# Patient Record
Sex: Female | Born: 1965 | Race: Black or African American | Hispanic: No | Marital: Married | Smoking: Former smoker
Health system: Southern US, Community
[De-identification: ages and names within clinical notes are randomized; demographics above are authoritative.]

## PROBLEM LIST (undated history)

## (undated) DIAGNOSIS — I10 Essential (primary) hypertension: Secondary | ICD-10-CM

## (undated) DIAGNOSIS — K529 Noninfective gastroenteritis and colitis, unspecified: Secondary | ICD-10-CM

## (undated) HISTORY — PX: SKIN GRAFT: SHX250

## (undated) HISTORY — PX: FOOT SURGERY: SHX648

## (undated) HISTORY — PX: BACK SURGERY: SHX140

## (undated) HISTORY — PX: CERVICAL FUSION: SHX112

## (undated) HISTORY — PX: ABDOMINAL HYSTERECTOMY: SHX81

---

## 2011-04-08 ENCOUNTER — Encounter: Payer: Self-pay | Admitting: *Deleted

## 2011-04-08 ENCOUNTER — Emergency Department (HOSPITAL_COMMUNITY)
Admission: EM | Admit: 2011-04-08 | Discharge: 2011-04-08 | Discharge status: Against Medical Advice | Payer: BC Managed Care – PPO | Attending: Emergency Medicine | Admitting: Emergency Medicine

## 2011-04-08 DIAGNOSIS — R0602 Shortness of breath: Secondary | ICD-10-CM | POA: Insufficient documentation

## 2011-04-08 HISTORY — DX: Essential (primary) hypertension: I10

## 2011-04-08 NOTE — ED Notes (Signed)
Patient states that she is leaving and will come back if her symptoms get worse.

## 2011-04-08 NOTE — ED Notes (Signed)
To ed for eval of sob and body aches since being placed on bactrim for UTI. Prior to bactrim pt was on amoxicillin for 2 wks for GI symptoms. Pt speaking in full complete sentences. Skin w/d, resp e/u. No tongue swelling. No drooling.

## 2011-04-11 ENCOUNTER — Other Ambulatory Visit: Payer: Self-pay

## 2011-04-11 ENCOUNTER — Encounter (HOSPITAL_BASED_OUTPATIENT_CLINIC_OR_DEPARTMENT_OTHER): Payer: Self-pay | Admitting: *Deleted

## 2011-04-11 ENCOUNTER — Emergency Department (HOSPITAL_BASED_OUTPATIENT_CLINIC_OR_DEPARTMENT_OTHER)
Admission: EM | Admit: 2011-04-11 | Discharge: 2011-04-12 | Disposition: A | Discharge status: Home / Self Care | Payer: BC Managed Care – PPO | Attending: Emergency Medicine | Admitting: Emergency Medicine

## 2011-04-11 DIAGNOSIS — R1011 Right upper quadrant pain: Secondary | ICD-10-CM | POA: Insufficient documentation

## 2011-04-11 DIAGNOSIS — R209 Unspecified disturbances of skin sensation: Secondary | ICD-10-CM | POA: Insufficient documentation

## 2011-04-11 HISTORY — DX: Noninfective gastroenteritis and colitis, unspecified: K52.9

## 2011-04-11 LAB — COMPREHENSIVE METABOLIC PANEL
ALT: 18 U/L (ref 0–35)
AST: 17 U/L (ref 0–37)
CO2: 22 mEq/L (ref 19–32)
Chloride: 103 mEq/L (ref 96–112)
Glucose, Bld: 102 mg/dL — ABNORMAL HIGH (ref 70–99)
Sodium: 136 mEq/L (ref 135–145)

## 2011-04-11 LAB — URINALYSIS, ROUTINE W REFLEX MICROSCOPIC
Bilirubin Urine: NEGATIVE
Hgb urine dipstick: NEGATIVE
Nitrite: NEGATIVE
Specific Gravity, Urine: 1.011 (ref 1.005–1.030)
Urobilinogen, UA: 0.2 mg/dL (ref 0.0–1.0)
pH: 5.5 (ref 5.0–8.0)

## 2011-04-11 LAB — CBC
Hemoglobin: 10.9 g/dL — ABNORMAL LOW (ref 12.0–15.0)
MCH: 29.9 pg (ref 26.0–34.0)
Platelets: 239 10*3/uL (ref 150–400)
RBC: 3.64 MIL/uL — ABNORMAL LOW (ref 3.87–5.11)
WBC: 8.1 10*3/uL (ref 4.0–10.5)

## 2011-04-11 LAB — LIPASE, BLOOD: Lipase: 17 U/L (ref 11–59)

## 2011-04-11 MED ORDER — ESOMEPRAZOLE MAGNESIUM 40 MG PO CPDR: 40.0000 mg | DELAYED_RELEASE_CAPSULE | Freq: Every day | ORAL | Status: DC

## 2011-04-11 NOTE — ED Notes (Signed)
Dr. Webb at bedside

## 2011-04-11 NOTE — ED Notes (Signed)
Pt given water and crackers to assess PO status

## 2011-04-11 NOTE — ED Notes (Signed)
Pt tolerated PO intake fine. Denies complaints at this time. MD aware

## 2011-04-11 NOTE — ED Notes (Signed)
Encouraged sister of Pt. Not to let the pt. Eat.

## 2011-04-11 NOTE — ED Notes (Signed)
C/o RUQ pain that radiates around breast and behind right shoulder blade x3days. Worsens after eating. Some nausea. Denies N/V. Recent dx of UTI, but states those symptoms have resolved since being treated with ABX. No hx of gallbladder issues.

## 2011-04-11 NOTE — ED Notes (Signed)
Patient states she has had ruq abdominal pain for the last 2 days.  Was at [facility] PCP and sent here to r/o her gallbladder.  States she has been to [facility] on Saturday for bladder infections and started on medications.

## 2011-04-12 ENCOUNTER — Other Ambulatory Visit (HOSPITAL_BASED_OUTPATIENT_CLINIC_OR_DEPARTMENT_OTHER): Payer: BC Managed Care – PPO

## 2011-04-12 NOTE — ED Provider Notes (Signed)
History     CSN: [id number]  Arrival date & time 04/11/11  1758   First MD Initiated Contact with Patient 04/11/11 2027      Chief Complaint  Patient presents with  . Abdominal Pain    ruq    (Consider location/radiation/quality/duration/timing/severity/associated sxs/prior treatment) HPI  45yoF pw abdominal pain. Pt c/o 2-3 days of epigastric/ruq abdominal pain. Feels "like it's knotted up" and full. Exacerbating factors include eating and drinking. +nausea, denies vomiting. Dec appetite today. Denies cp/back pain/sob. Denies constipation, diarrhea. She is passing gas. She is also having occ burning sensation which she feels is her reflux. States that over the past two days she feels that her food is occasionally becoming "stuck" in her chest but passes with intake of liquids. No coughing or gagging, no difficulty swallowing. She states at max the pain is 6/10 but none currently. Denies fever/chills. No known h/o gallstones. She did have upper endoscopy performed by [facility] within the year which per pt showed gastritis when she had similar sx in the past. She also had similar sx a few months ago and was given prevpack by her PMD with resolution of symptoms. Abd surgeries incl hysterectomy.  States she is being treated for uti at this time. Denies hematuria/dysuria/freq/urgency. Initially on bactrim but changed to macrobid after allergic reaction. Taking prednisone for same.   ED Notes, ED Provider Notes from 04/11/11 0000 to 04/11/11 18:33:45       Shela Commons, RN 04/11/2011 18:11      Patient states she has had ruq abdominal pain for the last 2 days. Was at [facility] PCP and sent here to r/o her gallbladder. States she has been to [facility] on Saturday for bladder infections and started on medications.     Past Medical History  Diagnosis Date  . Gastroenteritis     Past Surgical History  Procedure Date  . Cervical fusion   . Skin graft     left ankle  . Abdominal hysterectomy    . Foot surgery     right    No family history on file.  History  Substance Use Topics  . Smoking status: Current Everyday Smoker -- 0.5 packs/day for 10 years    Types: Cigarettes  . Smokeless tobacco: Not on file  . Alcohol Use: Yes     occassionally    OB History    Grav Para Term Preterm Abortions TAB SAB Ect Mult Living                  Review of Systems  All other systems reviewed and are negative.   except as noted HPI  Allergies  Septra  Home Medications   Current Outpatient Rx  Name Route Sig Dispense Refill  . ESOMEPRAZOLE MAGNESIUM 40 MG PO CPDR Oral Take 1 capsule (40 mg total) by mouth daily. 30 capsule 0  . HYDROXYZINE HCL 25 MG PO TABS Oral Take 25 mg by mouth 4 (four) times daily as needed. For allergic reaction     . NITROFURANTOIN MACROCRYSTAL 100 MG PO CAPS Oral Take 100 mg by mouth 2 (two) times daily.      Marland Kitchen PREDNISONE 20 MG PO TABS Oral Take 60 mg by mouth daily.        BP 132/87  Pulse 63  Temp(Src) 98.2 F (36.8 C) (Oral)  Resp 18  Ht 5\' 6"  (1.676 m)  Wt 203 lb (92.08 kg)  BMI 32.76 kg/m2  SpO2 100%  Physical Exam  Nursing note and vitals reviewed. Constitutional: She is oriented to person, place, and time. She appears well-developed.  HENT:  Head: Atraumatic.  Mouth/Throat: Oropharynx is clear and moist.  Eyes: Conjunctivae and EOM are normal. Pupils are equal, round, and reactive to light.  Neck: Normal range of motion. Neck supple.       No stridor  Cardiovascular: Normal rate, regular rhythm, normal heart sounds and intact distal pulses.   Pulmonary/Chest: Effort normal and breath sounds normal. No respiratory distress. She has no wheezes. She has no rales. She exhibits no tenderness.  Abdominal: Soft. She exhibits no distension. There is tenderness. There is no rebound and no guarding.       Min epigastric and ruq ttp  Musculoskeletal: Normal range of motion.  Neurological: She is alert and oriented to person, place, and  time.  Skin: Skin is warm and dry. No rash noted.  Psychiatric: She has a normal mood and affect.    Date: 04/12/2011  Rate: 74  Rhythm: normal sinus rhythm  QRS Axis: normal  Intervals: normal  ST/T Wave abnormalities: nonspecific T wave changes  Conduction Disutrbances:none  Narrative Interpretation:   Old EKG Reviewed: none available   ED Course  Procedures (including critical care time)  Labs Reviewed  URINALYSIS, ROUTINE W REFLEX MICROSCOPIC - Abnormal; Notable for the following:    APPearance CLOUDY (*)    All other components within normal limits  CBC - Abnormal; Notable for the following:    RBC 3.64 (*)    Hemoglobin 10.9 (*)    HCT 33.0 (*)    All other components within normal limits  COMPREHENSIVE METABOLIC PANEL - Abnormal; Notable for the following:    Glucose, Bld 102 (*)    All other components within normal limits  LIPASE, BLOOD   No results found.   1. Abdominal pain     MDM  P/W intermittent epigastric/RUQ pain worsened with food. None currently. Ddx incl gastritis/pud, biliary colic, pancreatitis, less likely cholecystitis. Labs reviewed and unremarkable including LFTS, lipase. She is tolerating PO solids and liquids here without difficulty. She is comfortable with plan for discharge home with nexium and will return tomorrow for RUQ ultrasound. Patient comfortable with plan.      Forbes Cellar, MD 04/12/11 [phone]

## 2019-08-16 ENCOUNTER — Ambulatory Visit: Payer: Self-pay | Attending: Internal Medicine

## 2019-08-16 DIAGNOSIS — Z23 Encounter for immunization: Secondary | ICD-10-CM

## 2019-08-16 NOTE — Progress Notes (Signed)
   Covid-19 Vaccination Clinic  Name:  Brooke Hart    MRN: [id number] DOB: 04/13/1965  08/16/2019  Brooke Hart was observed post Covid-19 immunization for 15 minutes without incident. She was provided with Vaccine Information Sheet and instruction to access the V-Safe system.   Brooke Hart was instructed to call 911 with any severe reactions post vaccine: Marland Kitchen Difficulty breathing  . Swelling of face and throat  . A fast heartbeat  . A bad rash all over body  . Dizziness and weakness   Immunizations Administered    Name Date Dose VIS Date Route   Moderna COVID-19 Vaccine 08/16/2019 12:39 PM 0.5 mL 03/2019 Intramuscular   Manufacturer: Moderna   Lot: [id number]   NDC: 11031-594-58

## 2019-09-13 ENCOUNTER — Ambulatory Visit: Payer: Self-pay

## 2019-09-13 DIAGNOSIS — Z23 Encounter for immunization: Secondary | ICD-10-CM

## 2019-09-13 NOTE — Progress Notes (Signed)
   Covid-19 Vaccination Clinic  Name:  Brooke Hart    MRN: [id number] DOB: Aug 19, 1965  09/13/2019  Brooke Hart was observed post Covid-19 immunization for 15 minutes without incident. She was provided with Vaccine Information Sheet and instruction to access the V-Safe system.   Brooke Hart was instructed to call 911 with any severe reactions post vaccine: Marland Kitchen Difficulty breathing  . Swelling of face and throat  . A fast heartbeat  . A bad rash all over body  . Dizziness and weakness   Immunizations Administered    Name Date Dose VIS Date Route   Moderna COVID-19 Vaccine 09/13/2019 10:20 AM 0.5 mL 03/2019 Intramuscular   Manufacturer: Gala Murdoch   Lot: [id number]   NDC: 38329-191-66

## 2021-05-03 ENCOUNTER — Other Ambulatory Visit: Payer: Self-pay

## 2021-05-04 ENCOUNTER — Encounter: Payer: Self-pay | Admitting: Family Medicine

## 2021-05-04 ENCOUNTER — Ambulatory Visit (INDEPENDENT_AMBULATORY_CARE_PROVIDER_SITE_OTHER): Payer: Managed Care, Other (non HMO) | Admitting: Family Medicine

## 2021-05-04 ENCOUNTER — Encounter: Payer: Self-pay | Admitting: Neurology

## 2021-05-04 VITALS — BP 120/74 | HR 87 | Temp 97.0°F | Ht 66.0 in | Wt 240.4 lb

## 2021-05-04 DIAGNOSIS — H6982 Other specified disorders of Eustachian tube, left ear: Secondary | ICD-10-CM | POA: Diagnosis not present

## 2021-05-04 DIAGNOSIS — R202 Paresthesia of skin: Secondary | ICD-10-CM | POA: Diagnosis not present

## 2021-05-04 DIAGNOSIS — R0683 Snoring: Secondary | ICD-10-CM

## 2021-05-04 NOTE — Progress Notes (Signed)
Established Patient Office Visit  Subjective:  Patient ID: Brooke Hart, female    DOB: December 05, 1965  Age: 56 y.o. MRN: [id number]  CC:  Chief Complaint  Patient presents with   Establish Care    NP/establish care sinus issues, discuss carpal tunnel.     HPI Brooke Hart presents for establishment of care and for evaluation of medical issues that are bothering her.  Ongoing history of left ear congestion with occasional discomfort.  There is itching in the ear.  She denies nasal congestion, rhinorrhea, postnasal drip, sneezing, itchy watery eyes, cough.  She has tried nasal steroids in the past without relief.  She snores.  She does wake up some through the night.  She does not necessarily always feel rested in the morning.  She complains of paresthesias in both of her arms.  She tells of paresthesias in her right second finger.  She is left-handed.  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  History of cervical fusion 20 years ago.  Neck is sometimes stiff.  She denies radiation of pain from her neck.  Paresthesias are in her arms.  Past Medical History:  Diagnosis Date   Gastroenteritis     Past Surgical History:  Procedure Laterality Date   ABDOMINAL HYSTERECTOMY     CERVICAL FUSION     FOOT SURGERY     right   SKIN GRAFT     left ankle    No family history on file.  Social History   Socioeconomic History   Marital status: Married    Spouse name: Not on file   Number of children: Not on file   Years of education: Not on file   Highest education level: Not on file  Occupational History   Not on file  Tobacco Use   Smoking status: Former    Packs/day: 0.50    Years: 10.00    Pack years: 5.00    Types: Cigarettes    Quit date: 04/03/2014    Years since quitting: 7.0   Smokeless tobacco: Not on file  Vaping Use   Vaping Use: Never used  Substance and Sexual Activity   Alcohol use: Not  Currently    Comment: occassionally   Drug use: No   Sexual activity: Yes    Birth control/protection: Surgical  Other Topics Concern   Not on file  Social History Narrative   Not on file   Social Determinants of Health   Financial Resource Strain: Not on file  Food Insecurity: Not on file  Transportation Needs: Not on file  Physical Activity: Not on file  Stress: Not on file  Social Connections: Not on file  Intimate Partner Violence: Not on file    Outpatient Medications Prior to Visit  Medication Sig Dispense Refill   amLODipine-valsartan (EXFORGE) 5-160 MG tablet Take 1 tablet by mouth every morning.     hydrochlorothiazide (HYDRODIURIL) 12.5 MG tablet Take by mouth.     phentermine (ADIPEX-P) 37.5 MG tablet Take by mouth.     esomeprazole (NEXIUM) 40 MG capsule Take 1 capsule (40 mg total) by mouth daily. 30 capsule 0   hydrOXYzine (ATARAX/VISTARIL) 25 MG tablet Take 25 mg by mouth 4 (four) times daily as needed. For allergic reaction      No facility-administered medications prior to visit.    Allergies  Allergen Reactions   Septra [Sulfamethoxazole W/Trimethoprim (Co-Trimoxazole)] Shortness Of  Breath and Rash    COUGH, LEG PAIN & HEADACHE    ROS Review of Systems  Constitutional:  Negative for chills, diaphoresis, fatigue, fever and unexpected weight change.  HENT:  Positive for hearing loss. Negative for congestion, ear pain, postnasal drip, rhinorrhea and sneezing.   Eyes:  Negative for photophobia.  Respiratory:  Negative for cough and wheezing.   Cardiovascular: Negative.   Gastrointestinal: Negative.   Endocrine: Negative for polyphagia and polyuria.  Genitourinary: Negative.   Musculoskeletal:  Positive for neck stiffness. Negative for neck pain.  Neurological:  Positive for numbness. Negative for speech difficulty and weakness.     Objective:    Physical Exam Vitals and nursing note reviewed.  Constitutional:      General: She is not in acute  distress.    Appearance: Normal appearance. She is not ill-appearing, toxic-appearing or diaphoretic.  HENT:     Head: Normocephalic and atraumatic.     Right Ear: Tympanic membrane, ear canal and external ear normal.     Left Ear: Tympanic membrane, ear canal and external ear normal.     Mouth/Throat:     Mouth: Mucous membranes are moist.     Pharynx: Oropharynx is clear. No oropharyngeal exudate or posterior oropharyngeal erythema.   Eyes:     General: No visual field deficit or scleral icterus.       Right eye: No discharge.        Left eye: No discharge.     Extraocular Movements: Extraocular movements intact.     Conjunctiva/sclera: Conjunctivae normal.     Pupils: Pupils are equal, round, and reactive to light.  Cardiovascular:     Rate and Rhythm: Normal rate and regular rhythm.  Pulmonary:     Effort: Pulmonary effort is normal.     Breath sounds: Normal breath sounds.  Musculoskeletal:     Cervical back: No rigidity or tenderness. No pain with movement. Decreased range of motion.     Comments: There is some decrease in extension and rotation.  Spurling's tests are negative on both sides.  Lymphadenopathy:     Cervical: No cervical adenopathy.  Skin:    General: Skin is warm and dry.  Neurological:     General: No focal deficit present.     Mental Status: She is alert and oriented to person, place, and time.     Cranial Nerves: No dysarthria or facial asymmetry.     Motor: No weakness.     Deep Tendon Reflexes:     Reflex Scores:      Brachioradialis reflexes are 1+ on the right side and 1+ on the left side.      Patellar reflexes are 1+ on the right side and 1+ on the left side.      Achilles reflexes are 1+ on the right side and 1+ on the left side.    Comments:  Negative Tinel's and Phalen's test on both sides.  Negative Spurling's test to right and left.  Psychiatric:        Mood and Affect: Mood normal.        Behavior: Behavior normal.    BP 120/74 (BP  Location: Right Arm, Patient Position: Sitting, Cuff Size: Large)    Pulse 87    Temp (!) 97 F (36.1 C) (Temporal)    Ht 5\' 6"  (1.676 m)    Wt 240 lb 6.4 oz (109 kg)    SpO2 97%    BMI 38.80 kg/m  Wt Readings  from Last 3 Encounters:  05/04/21 240 lb 6.4 oz (109 kg)  04/11/11 203 lb (92.1 kg)     Health Maintenance Due  Topic Date Due   HIV Screening  Never done   Hepatitis C Screening  Never done   PAP SMEAR-Modifier  Never done   COLONOSCOPY (Pts 45-2061yrs Insurance coverage will need to be confirmed)  Never done   MAMMOGRAM  Never done   Zoster Vaccines- Shingrix (1 of 2) Never done   COVID-19 Vaccine (3 - Booster for Moderna series) 11/08/2019    There are no preventive care reminders to display for this patient.  No results found for: TSH Lab Results  Component Value Date   WBC 8.1 04/11/2011   HGB 10.9 (L) 04/11/2011   HCT 33.0 (L) 04/11/2011   MCV 90.7 04/11/2011   PLT 239 04/11/2011   Lab Results  Component Value Date   NA 136 04/11/2011   K 3.6 04/11/2011   CO2 22 04/11/2011   GLUCOSE 102 (H) 04/11/2011   BUN 8 04/11/2011   CREATININE 0.70 04/11/2011   BILITOT 0.4 04/11/2011   ALKPHOS 59 04/11/2011   AST 17 04/11/2011   ALT 18 04/11/2011   PROT 7.4 04/11/2011   ALBUMIN 4.2 04/11/2011   CALCIUM 9.5 04/11/2011   No results found for: CHOL No results found for: HDL No results found for: LDLCALC No results found for: TRIG No results found for: CHOLHDL No results found for: [id number]    Assessment & Plan:   Problem List Items Addressed This Visit       Nervous and Auditory   Dysfunction of left eustachian tube - Primary   Relevant Orders   Ambulatory referral to ENT     Other   Paresthesia of arm   Relevant Orders   Ambulatory referral to Neurology   Snores    No orders of the defined types were placed in this encounter.   Follow-up: Return in about 2 months (around 07/02/2021), or return fasting in 2 months..  Information was given on  eustachian tube dysfunction.  ENT referral.  Demonstrated eustachian tube exercises and asked her to perform these 3-5 times daily.  Neurology referral for possible nerve conduction studies.  She will ask her husband to observe her breathing or apnea.  We will consider sleep study.  Mliss SaxWilliam Alfred Errika Narvaiz, MD

## 2021-05-16 ENCOUNTER — Emergency Department (HOSPITAL_BASED_OUTPATIENT_CLINIC_OR_DEPARTMENT_OTHER): Payer: Managed Care, Other (non HMO)

## 2021-05-16 ENCOUNTER — Emergency Department (HOSPITAL_BASED_OUTPATIENT_CLINIC_OR_DEPARTMENT_OTHER)
Admission: EM | Admit: 2021-05-16 | Discharge: 2021-05-16 | Disposition: A | Discharge status: Home / Self Care | Payer: Managed Care, Other (non HMO) | Attending: Emergency Medicine | Admitting: Emergency Medicine

## 2021-05-16 ENCOUNTER — Encounter (HOSPITAL_BASED_OUTPATIENT_CLINIC_OR_DEPARTMENT_OTHER): Payer: Self-pay | Admitting: Emergency Medicine

## 2021-05-16 ENCOUNTER — Other Ambulatory Visit: Payer: Self-pay

## 2021-05-16 DIAGNOSIS — S0990XA Unspecified injury of head, initial encounter: Secondary | ICD-10-CM | POA: Diagnosis present

## 2021-05-16 DIAGNOSIS — W208XXA Other cause of strike by thrown, projected or falling object, initial encounter: Secondary | ICD-10-CM | POA: Diagnosis not present

## 2021-05-16 MED ORDER — IBUPROFEN 800 MG PO TABS
800.0000 mg | ORAL_TABLET | Freq: Once | ORAL | Status: AC
  Administered 2021-05-16: 800 mg via ORAL
  Filled 2021-05-16: qty 1

## 2021-05-16 NOTE — ED Provider Notes (Signed)
[facility] EMERGENCY DEPARTMENT Provider Note   CSN: [id number] Arrival date & time: 05/16/21  3570     History  Chief Complaint  Patient presents with   Head Injury    Brooke Hart is a 56 y.o. female.  Presented to the ER with concern for head trauma.  Patient reports that while she was getting ready for work this morning, a heavy piece of wood fell from the bed and struck the top of her head.  Has been having moderate pain at the site ever since.  Did not have LOC, no vomiting.  Denies any neck pain or stiffness.  No other trauma.  History also obtained from husband, Brett Canales he reports that he has not noticed any change in her mental status or behavior this morning.  Patient reports that she does not have any major medical problems, not on blood thinners.  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  Reviewed last PCP visit, takes antihypertensives  HPI     Home Medications Prior to Admission medications   Medication Sig Start Date End Date Taking? Authorizing Provider  amLODipine-valsartan (EXFORGE) 5-160 MG tablet Take 1 tablet by mouth every morning. 03/08/21  Yes [provider]  hydrochlorothiazide (HYDRODIURIL) 25 MG tablet Take 25 mg by mouth daily. 05/01/21  Yes [provider]  phentermine (ADIPEX-P) 37.5 MG tablet Take by mouth.    [provider]      Allergies    Septra [sulfamethoxazole w/trimethoprim (co-trimoxazole)], Sulfamethoxazole-trimethoprim, Sulfa antibiotics, and Sulfamethoxazole    Review of Systems   Review of Systems  Neurological:  Positive for headaches.  All other systems reviewed and are negative.  Physical Exam Updated Vital Signs BP 113/78    Pulse 79    Temp 97.7 F (36.5 C) (Oral)    Resp 18    Ht 5\' 6"  (1.676 m)    Wt 108 kg    SpO2 97%    BMI 38.41 kg/m  Physical Exam Vitals and nursing note reviewed.  Constitutional:      General: She is not in acute distress.    Appearance: She is  well-developed.  HENT:     Head: Normocephalic and atraumatic.     Comments: There is some tenderness over the the top of her occiput but there is no palpable deformity, no laceration or hematoma noted Eyes:     Conjunctiva/sclera: Conjunctivae normal.  Neck:     Comments: No midline C-spine tenderness, no step-off or deformity Cardiovascular:     Rate and Rhythm: Normal rate and regular rhythm.     Heart sounds: No murmur heard. Pulmonary:     Effort: Pulmonary effort is normal. No respiratory distress.     Breath sounds: Normal breath sounds.  Abdominal:     Palpations: Abdomen is soft.     Tenderness: There is no abdominal tenderness.  Musculoskeletal:        General: No swelling, deformity or signs of injury.     Cervical back: Neck supple.  Skin:    General: Skin is warm and dry.     Capillary Refill: Capillary refill takes less than 2 seconds.  Neurological:     Mental Status: She is alert.  Psychiatric:        Mood and Affect: Mood normal.    ED Results / Procedures / Treatments   Labs (all labs ordered are listed, but only abnormal results are displayed) Labs Reviewed - No data to display  EKG None  Radiology CT Head Wo Contrast  Result Date: 05/16/2021 CLINICAL DATA:  Head trauma, moderate-severe EXAM: CT HEAD WITHOUT CONTRAST TECHNIQUE: Contiguous axial images were obtained from the base of the skull through the vertex without intravenous contrast. RADIATION DOSE REDUCTION: This exam was performed according to the departmental dose-optimization program which includes automated exposure control, adjustment of the mA and/or kV according to patient size and/or use of iterative reconstruction technique. COMPARISON:  Report from September 27, 2010 MRI (without images). FINDINGS: Brain: No evidence of acute infarction, hemorrhage, hydrocephalus, extra-axial collection or mass lesion/mass effect. Vascular: No hyperdense vessel identified. Skull: No acute fracture.  Sinuses/Orbits: Mild paranasal sinus mucosal thickening the visualized sinuses without visible air-fluid level. Unremarkable visualized orbits. Other: No mastoid effusions. Incompletely imaged postoperative change at the craniocervical junction. IMPRESSION: No evidence of acute intracranial abnormality. Electronically Signed   By: Feliberto Harts M.D.   On: 05/16/2021 08:12    Procedures Procedures    Medications Ordered in ED Medications - No data to display  ED Course/ Medical Decision Making/ A&P                           Medical Decision Making Amount and/or Complexity of Data Reviewed Radiology: ordered.   56 year old lady presenting to ER with concern for head trauma, reports heavy wooden object striking the top of her occiput.  No obvious deformity.  Given reported mechanism and ongoing pain, checked CT head to rule out subdural or epidural hematoma.  Per my review of CT scan, no acute findings.  Radiology report agrees.  No other traumatic findings on physical exam patient otherwise well-appearing in no distress.  Discharged home with husband.  Additional history is obtained from chart review, review of last PCP note, as well as discussion with husband.        Final Clinical Impression(s) / ED Diagnoses Final diagnoses:  Injury of head, initial encounter    Rx / DC Orders ED Discharge Orders     None         Milagros Loll, MD 05/16/21 0830

## 2021-05-16 NOTE — Discharge Instructions (Signed)
Take Tylenol or Motrin as needed for pain control.  Come back to ER if you develop worsening headache, vomiting, episode of passing out, lethargy or other new concerning symptom.

## 2021-05-16 NOTE — ED Triage Notes (Signed)
Pt woke up this morning and hit the top pole of her bed.  A piece of the bed, wooden object, fell off and hit her on the top of her head.  No LOC, no bleeding noted.

## 2021-06-07 NOTE — Progress Notes (Signed)
?[facility] ?Neurology Division ?Clinic Note - Initial Visit ? ? ?Date: 06/08/21 ? ?Brooke Hart ?MRN: [id number] ?DOB: [birth year] ? ? ?Dear Dr. Doreene Burke: ? ?Thank you for your kind referral of Brooke Hart for consultation of hand numbness. Although her history is well known to you, please allow [place] to reiterate it for the purpose of our medical record. The patient was accompanied to the clinic by self. ? ? ?History of Present Illness: ?Brooke Hart is a 56 y.o. left-handed female with history of cervical fusion and hypertension presenting for evaluation of bilateral hand paresthesias.  ? ?Two years ago she recalls doing home exercises and felt that her left arm would cause her to catch her breath in certain positions, such as when leaning on the back of her shoulder.  She did not have shooting pain.  She did PT with variable benefit.  She continues to have abnormal feeling in the left arm/shoulder and which causes her to catch her breath.  She has not seen sports medicine/orthopeadics.  ? ?She also has intermittent numbness involving both arms for the past 5 months, which is worse on the left.  It is triggered by bending at the elbow or resting the elbow. 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  She has weakness in the left arm.  She is not dropping things.  ? ?She has neck stiffness and had cervical fusion 25 years ago. ? ?She works as Print production planner for [employer].   ? ?Past Medical History:  ?Diagnosis Date  ? Gastroenteritis   ? Hypertension   ? ? ?Past Surgical History:  ?Procedure Laterality Date  ? ABDOMINAL HYSTERECTOMY    ? BACK SURGERY    ? CERVICAL FUSION    ? FOOT SURGERY    ? right  ? SKIN GRAFT    ? left ankle  ? ? ? ?Medications:  ?Outpatient Encounter Medications as of 06/08/2021  ?Medication Sig  ? amLODipine-valsartan (EXFORGE) 5-160 MG tablet Take 1 tablet by mouth every morning.  ? hydrochlorothiazide (HYDRODIURIL) 25 MG tablet  Take 25 mg by mouth daily.  ? phentermine (ADIPEX-P) 37.5 MG tablet Take by mouth.  ? ?No facility-administered encounter medications on file as of 06/08/2021.  ? ? ?Allergies:  ?Allergies  ?Allergen Reactions  ? Septra [Sulfamethoxazole W/Trimethoprim (Co-Trimoxazole)] Shortness Of Breath and Rash  ?  COUGH, LEG PAIN & HEADACHE  ? Sulfamethoxazole-Trimethoprim Rash and Shortness Of Breath  ?  COUGH, LEG PAIN & HEADACHE ?COUGH, LEG PAIN & HEADACHE ?  ? Sulfa Antibiotics Other (See Comments)  ?  Dry mouth  ? Sulfamethoxazole Other (See Comments) and Hives  ?  Dry mouth   ? ? ?Family History: ?Family History  ?Problem Relation Age of Onset  ? Alcoholism Father   ? ? ?Social History: ?Social History  ? ?Tobacco Use  ? Smoking status: Former  ?  Packs/day: 0.50  ?  Years: 10.00  ?  Pack years: 5.00  ?  Types: Cigarettes  ?  Quit date: 04/03/2014  ?  Years since quitting: 7.1  ?Vaping Use  ? Vaping Use: Never used  ?Substance Use Topics  ? Alcohol use: Not Currently  ?  Comment: occassionally  ? Drug use: No  ? ?Social History  ? ?Social History Narrative  ? Lives in a   ? ? ?Vital Signs:  ?BP 114/74   Pulse 74   Ht 5\' 6"  (1.676 m)  Wt 243 lb (110.2 kg)   SpO2 97%   BMI 39.22 kg/m?  ?  ? ?Neurological Exam: ?MENTAL STATUS including orientation to time, place, person, recent and remote memory, attention span and concentration, language, and fund of knowledge is normal.  Speech is not dysarthric. ? ?CRANIAL NERVES: ?II:  No visual field defects.   ?III-IV-VI: Pupils equal round and reactive to light.  Normal conjugate, extra-ocular eye movements in all directions of gaze.  No nystagmus.  No ptosis.   ?V:  Normal facial sensation.    ?VII:  Normal facial symmetry and movements.   ?VIII:  Normal hearing and vestibular function.   ?IX-X:  Normal palatal movement.   ?XI:  Normal shoulder shrug and head rotation.   ?XII:  Normal tongue strength and range of motion, no deviation or fasciculation. ? ?MOTOR:  Motor strength is  5/5 throughout, including intrinsic hand muscles. No atrophy, fasciculations or abnormal movements.  No pronator drift.  ? ?MSRs:  ?Right        Left                  ?brachioradialis 2+  2+  ?biceps 2+  2+  ?triceps 2+  2+  ?patellar 2+  2+  ?ankle jerk 2+  2+  ?Hoffman no  no  ?plantar response down  down  ? ?SENSORY:  Normal and symmetric perception of light touch, pinprick, vibration, and proprioception.   ? ?COORDINATION/GAIT: Normal finger-to- nose-finger.  Intact rapid alternating movements bilaterally.  Gait narrow based and stable. Tandem and stressed gait intact.  ? ? ?IMPRESSION: ?Bilateral hand paresthesias most suggestive of ulnar neuropathy ? - NCS/EMG of the arms ? - Strategies to avoid nerve compression discussed such as avoid leaning on elbows and over bending elbow ? ?2.   Left shoulder pain/discomfort vs rib dysfunction ? - If EMG does not show radiculopathy, recommend she follow-up with sports medicine ? ?Further recommendations pending results. ? ? ? ?Thank you for allowing me to participate in patient's care.  If I can answer any additional questions, I would be pleased to do so.   ? ?Sincerely, ? ? ? ?Tequia Wolman K. Allena Katz, DO ? ?

## 2021-06-08 ENCOUNTER — Encounter: Payer: Self-pay | Admitting: Neurology

## 2021-06-08 ENCOUNTER — Other Ambulatory Visit: Payer: Self-pay

## 2021-06-08 ENCOUNTER — Ambulatory Visit (INDEPENDENT_AMBULATORY_CARE_PROVIDER_SITE_OTHER): Payer: Managed Care, Other (non HMO) | Admitting: Neurology

## 2021-06-08 VITALS — BP 114/74 | HR 74 | Ht 66.0 in | Wt 243.0 lb

## 2021-06-08 DIAGNOSIS — G5623 Lesion of ulnar nerve, bilateral upper limbs: Secondary | ICD-10-CM | POA: Diagnosis not present

## 2021-06-08 NOTE — Patient Instructions (Signed)
Nerve testing of the arms ° °ELECTROMYOGRAM AND NERVE CONDUCTION STUDIES (EMG/NCS) INSTRUCTIONS ° °How to Prepare °The neurologist conducting the EMG will need to know if you have certain medical conditions. Tell the neurologist and other EMG lab personnel if you: °Have a pacemaker or any other electrical medical device °Take blood-thinning medications °Have hemophilia, a blood-clotting disorder that causes prolonged bleeding °Bathing °Take a shower or bath shortly before your exam in order to remove oils from your skin. Don’t apply lotions or creams before the exam.  °What to Expect °You’ll likely be asked to change into a hospital gown for the procedure and lie down on an examination table. The following explanations can help you understand what will happen during the exam.  °Electrodes. The neurologist or a technician places surface electrodes at various locations on your skin depending on where you’re experiencing symptoms. Or the neurologist may insert needle electrodes at different sites depending on your symptoms.  °Sensations. The electrodes will at times transmit a tiny electrical current that you may feel as a twinge or spasm. The needle electrode may cause discomfort or pain that usually ends shortly after the needle is removed. °If you are concerned about discomfort or pain, you may want to talk to the neurologist about taking a short break during the exam.  °Instructions. During the needle EMG, the neurologist will assess whether there is any spontaneous electrical activity when the muscle is at rest - activity that isn’t present in healthy muscle tissue - and the degree of activity when you slightly contract the muscle.  °He or she will give you instructions on resting and contracting a muscle at appropriate times. Depending on what muscles and nerves the neurologist is examining, he or she may ask you to change positions during the exam.  °After your EMG °You may experience some temporary, minor  bruising where the needle electrode was inserted into your muscle. This bruising should fade within several days. If it persists, contact your primary care doctor.  ° °

## 2021-06-29 ENCOUNTER — Ambulatory Visit: Payer: Managed Care, Other (non HMO) | Admitting: Neurology

## 2021-07-04 ENCOUNTER — Ambulatory Visit: Payer: Managed Care, Other (non HMO) | Admitting: Family Medicine

## 2021-07-04 ENCOUNTER — Telehealth: Payer: Self-pay

## 2021-07-04 NOTE — Telephone Encounter (Signed)
Patient/Caregiver was notified of No Show/Late Cancellation Policy & possible [id number] charge. ?Visit was cancelled with reason "No Show/Cancel within 24 hours" for tracking & charging. ? ?Caller Name: Junior Guyette ?  ?Date of APPT: 06/03/21 ?Reason given for no show/late cancellation: no reason  ?No Show Letter printed & put in outgoing mail (Yes/No): Yes ? ?~~~Route message to admin supervisor and clinical team/CMA~~~ ? ? ? ?

## 2021-07-07 ENCOUNTER — Ambulatory Visit (INDEPENDENT_AMBULATORY_CARE_PROVIDER_SITE_OTHER): Payer: Managed Care, Other (non HMO) | Admitting: Neurology

## 2021-07-07 ENCOUNTER — Encounter: Payer: Managed Care, Other (non HMO) | Admitting: Neurology

## 2021-07-07 DIAGNOSIS — G5603 Carpal tunnel syndrome, bilateral upper limbs: Secondary | ICD-10-CM

## 2021-07-07 DIAGNOSIS — G5623 Lesion of ulnar nerve, bilateral upper limbs: Secondary | ICD-10-CM

## 2021-07-07 NOTE — Progress Notes (Signed)
? ? ?  Follow-up Visit ? ? ?Date: 07/07/21 ? ? ?Brooke Hart ?MRN: [id number] ?DOB: Aug 27, 1965 ? ? ?Interim History: ?Brooke Hart is a 56 y.o. left-handed female with history of cervicla fusion and hypertension returning to the clinic for follow-up of bilateral hand paresthesias.  The patient was accompanied to the clinic by self. ? ?History of present illness: ?Two years ago she recalls doing home exercises and felt that her left arm would cause her to catch her breath in certain positions, such as when leaning on the back of her shoulder.  She did not have shooting pain.  She did PT with variable benefit.  She continues to have abnormal feeling in the left arm/shoulder and which causes her to catch her breath.  She has not seen sports medicine/orthopeadics.  ?  ?She also has intermittent numbness involving both arms for the past 5 months, which is worse on the left.  It is triggered by bending at the elbow or resting the elbow. 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  She has weakness in the left arm.  She is not dropping things.  ?  ?She has neck stiffness and had cervical fusion 25 years ago. ? ?UPDATE 07/07/2021:  She is here for EDX of the arms.  Her hands continue to wake her up from sleeping with tingling and numbness.  Symptoms are worse on the left.  No weakness.  ? ?Medications:  ?Current Outpatient Medications on File Prior to Visit  ?Medication Sig Dispense Refill  ? amLODipine-valsartan (EXFORGE) 5-160 MG tablet Take 1 tablet by mouth every morning.    ? hydrochlorothiazide (HYDRODIURIL) 25 MG tablet Take 25 mg by mouth daily.    ? phentermine (ADIPEX-P) 37.5 MG tablet Take by mouth.    ? ?No current facility-administered medications on file prior to visit.  ? ? ?Allergies:  ?Allergies  ?Allergen Reactions  ? Septra [Sulfamethoxazole W/Trimethoprim (Co-Trimoxazole)] Shortness Of Breath and Rash  ?  COUGH, LEG PAIN & HEADACHE  ?  Sulfamethoxazole-Trimethoprim Rash and Shortness Of Breath  ?  COUGH, LEG PAIN & HEADACHE ?COUGH, LEG PAIN & HEADACHE ?  ? Sulfa Antibiotics Other (See Comments)  ?  Dry mouth  ? Sulfamethoxazole Other (See Comments) and Hives  ?  Dry mouth   ? ? ?Vital Signs:  ?There were no vitals taken for this visit. ?  ?Exam deferred ? ?Data: ?NCS/EMG of the arms 07/07/2021: ?Bilateral median neuropathy at or distal to the wrist, consistent with a clinical diagnosis of carpal tunnel syndrome.  Overall, these findings are mild in degree electrically and worse on the left. ? ?IMPRESSION/PLAN: ?Bilateral carpal tunnel syndrome, mild and worse on the left ? - Recommend she start to use wrist braces at night time and during the day, as needed ? - If symptoms do not improve, we can offer low dose gabapentin 300mg  at bedtime ? ? ?Thank you for allowing me to participate in patient's care.  If I can answer any additional questions, I would be pleased to do so.   ? ?Sincerely, ? ? ? ?Tenzin Pavon K. , DO ? ? ?

## 2021-07-07 NOTE — Procedures (Signed)
[facility]  ?[address] ? [place], [place] [address] ?Tel: [phone] ?Fax:  [phone] ?Test Date:  07/07/2021 ? ?Patient: Brooke Hart DOB: 1965/12/12 Physician: Nita Sickle, DO  ?Sex: Female Height: 5\' 6"  Ref Phys: , DO  ?ID#: Nita Sickle   Technician:   ? ?Patient Complaints: ?This is a 56 year old female referred for evaluation of bilateral hand paresthesias. ? ?NCV & EMG Findings: ?Extensive electrodiagnostic testing of the right upper extremity and additional studies of the left shows:  ?Bilateral median sensory responses show prolonged latency (R3.7, L4.2 ms).  Bilateral ulnar sensory responses are within normal limits.   ?Bilateral median and ulnar motor responses are within normal limits.   ?There is no evidence of active or chronic motor axonal loss changes affecting any of the tested muscles.  Motor unit configuration and recruitment pattern is within normal limits.   ? ?Impression: ?Bilateral median neuropathy at or distal to the wrist, consistent with a clinical diagnosis of carpal tunnel syndrome.  Overall, these findings are mild in degree electrically and worse on the left. ? ? ?___________________________ ?58, DO ? ? ? ?Nerve Conduction Studies ?Anti Sensory Summary Table ? ? Stim Site NR Peak (ms) Norm Peak (ms) P-T Amp (?V) Norm P-T Amp  ?Left Median Anti Sensory (2nd Digit)  35?C  ?Wrist    4.2 <3.6 18.9 >15  ?Right Median Anti Sensory (2nd Digit)  35?C  ?Wrist    3.7 <3.6 26.7 >15  ?Left Ulnar Anti Sensory (5th Digit)  35?C  ?Wrist    2.8 <3.1 32.0 >10  ?Right Ulnar Anti Sensory (5th Digit)  35?C  ?Wrist    2.6 <3.1 36.0 >10  ? ?Motor Summary Table ? ? Stim Site NR Onset (ms) Norm Onset (ms) O-P Amp (mV) Norm O-P Amp Site1 Site2 Delta-0 (ms) Dist (cm) Vel (m/s) Norm Vel (m/s)  ?Left Median Motor (Abd Poll Brev)  35?C  ?Wrist    4.0 <4.0 9.2 >6 Elbow Wrist 4.8 30.0 62 >50  ?Elbow    8.8  8.8         ?Right Median Motor (Abd Poll Brev)  35?C   ?Wrist    3.4 <4.0 13.5 >6 Elbow Wrist 4.6 30.0 65 >50  ?Elbow    8.0  12.5         ?Left Ulnar Motor (Abd Dig Minimi)  35?C  ?Wrist    2.0 <3.1 8.7 >7 B Elbow Wrist 3.8 25.0 66 >50  ?B Elbow    5.8  8.5  A Elbow B Elbow 1.6 10.0 62 >50  ?A Elbow    7.4  8.2         ?Right Ulnar Motor (Abd Dig Minimi)  35?C  ?Wrist    1.7 <3.1 8.8 >7 B Elbow Wrist 4.3 26.0 60 >50  ?B Elbow    6.0  8.7  A Elbow B Elbow 1.5 10.0 67 >50  ?A Elbow    7.5  8.4         ? ?EMG ? ? Side Muscle Ins Act Fibs Psw Fasc Number Recrt Dur Dur. Amp Amp. Poly Poly. Comment  ?Right 1stDorInt Nml Nml Nml Nml Nml Nml Nml Nml Nml Nml Nml Nml N/A  ?Right Abd Poll Brev Nml Nml Nml Nml Nml Nml Nml Nml Nml Nml Nml Nml N/A  ?Right PronatorTeres Nml Nml Nml Nml Nml Nml Nml Nml Nml Nml Nml Nml N/A  ?Right Biceps Nml Nml Nml Nml Nml Nml  Nml Nml Nml Nml Nml Nml N/A  ?Right Triceps Nml Nml Nml Nml Nml Nml Nml Nml Nml Nml Nml Nml N/A  ?Right Deltoid Nml Nml Nml Nml Nml Nml Nml Nml Nml Nml Nml Nml N/A  ?Left 1stDorInt Nml Nml Nml Nml Nml Nml Nml Nml Nml Nml Nml Nml N/A  ?Left Abd Poll Brev Nml Nml Nml Nml Nml Nml Nml Nml Nml Nml Nml Nml N/A  ?Left PronatorTeres Nml Nml Nml Nml Nml Nml Nml Nml Nml Nml Nml Nml N/A  ?Left Biceps Nml Nml Nml Nml Nml Nml Nml Nml Nml Nml Nml Nml N/A  ?Left Triceps Nml Nml Nml Nml Nml Nml Nml Nml Nml Nml Nml Nml N/A  ?Left Deltoid Nml Nml Nml Nml Nml Nml Nml Nml Nml Nml Nml Nml N/A  ? ? ? ? ?Waveforms: ?    ? ?    ? ?   ? ? ?

## 2021-07-12 NOTE — Telephone Encounter (Signed)
1st no show, fee waived ?

## 2021-12-09 ENCOUNTER — Encounter: Payer: Self-pay | Admitting: Neurology

## 2021-12-09 ENCOUNTER — Ambulatory Visit: Payer: Managed Care, Other (non HMO) | Admitting: Neurology

## 2021-12-09 VITALS — BP 114/77 | HR 84 | Ht 66.0 in | Wt 243.0 lb

## 2021-12-09 DIAGNOSIS — G5603 Carpal tunnel syndrome, bilateral upper limbs: Secondary | ICD-10-CM | POA: Diagnosis not present

## 2021-12-09 DIAGNOSIS — M542 Cervicalgia: Secondary | ICD-10-CM | POA: Diagnosis not present

## 2021-12-09 NOTE — Progress Notes (Signed)
Follow-up Visit   Date: 12/09/21   Brooke Hart MRN: [id number] DOB: Jun 24, 1965   Interim History: Brooke Hart is a 56 y.o. left-handed female with history of cervical fusion and hypertension returning to the clinic for follow-up of bilateral hand paresthesias.  The patient was accompanied to the clinic by self.  IMPRESSION/PLAN: Bilateral arm paresthesias.  NCS/EMG consistent with bilateral CTS (mild), however, her symptoms involve the entire arm which cannot be explained for CTS alone.   - Start neck PT to see if this could be stemming from radicular pathology  - Recommend using a wrist brace at night time to see if this helps   - If no improvement, the next step is MRI cervical spine  ------------------------------------------------------------------------  History of present illness: Two years ago she recalls doing home exercises and felt that her left arm would cause her to catch her breath in certain positions, such as when leaning on the back of her shoulder.  She did not have shooting pain.  She did PT with variable benefit.  She continues to have abnormal feeling in the left arm/shoulder and which causes her to catch her breath.  She has not seen sports medicine/orthopeadics.    She also has intermittent numbness involving both arms for the past 5 months, which is worse on the left.  It is triggered by bending at the elbow or resting the elbow. 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  She has weakness in the left arm.  She is not dropping things.    She has neck stiffness and had cervical fusion 25 years ago.  UPDATE 07/07/2021:  She is here for EDX of the arms.  Her hands continue to wake her up from sleeping with tingling and numbness.  Symptoms are worse on the left.  No weakness.   UPDATE 12/09/2021:  She is here for follow-up visit. She has ongoing numbness of the arms down to the hand.  She reports hand fall asleep  if she claps too loud or if she is using the arms a lot on a particular day.  Sometimes, she has difficulty moving the hands.  She feels that arms are also weaker.   Medications:  Current Outpatient Medications on File Prior to Visit  Medication Sig Dispense Refill   amLODipine-valsartan (EXFORGE) 5-160 MG tablet Take 1 tablet by mouth every morning.     hydrochlorothiazide (HYDRODIURIL) 25 MG tablet Take 25 mg by mouth daily.     phentermine (ADIPEX-P) 37.5 MG tablet Take by mouth.     No current facility-administered medications on file prior to visit.    Allergies:  Allergies  Allergen Reactions   Septra [Sulfamethoxazole W/Trimethoprim (Co-Trimoxazole)] Shortness Of Breath and Rash    COUGH, LEG PAIN & HEADACHE   Sulfamethoxazole-Trimethoprim Rash and Shortness Of Breath    COUGH, LEG PAIN & HEADACHE COUGH, LEG PAIN & HEADACHE    Sulfa Antibiotics Other (See Comments)    Dry mouth   Sulfamethoxazole Other (See Comments) and Hives    Dry mouth     Vital Signs:  BP 114/77   Pulse 84   Ht 5\' 6"  (1.676 m)   Wt 243 lb (110.2 kg)   SpO2 94%   BMI 39.22 kg/m    Neurological Exam: MENTAL STATUS including orientation to time, place, person, recent and remote memory, attention span and concentration, language, and fund of knowledge is normal.  Speech is  not dysarthric.  CRANIAL NERVES: Pupils equal round and reactive to light.  Normal conjugate, extra-ocular eye movements in all directions of gaze.  No ptosis.  MOTOR:  Motor strength is 5/5 in all extremities.  No atrophy, fasciculations or abnormal movements.  No pronator drift.  Tone is normal.    MSRs:  Reflexes are 2+/4 throughout.  SENSORY:  Intact to temperature throughout.  COORDINATION/GAIT:  Normal finger-to- nose-finger.  Intact rapid alternating movements bilaterally.  Gait narrow based and stable.    Data: NCS/EMG of the arms 07/07/2021: Bilateral median neuropathy at or distal to the wrist, consistent with a  clinical diagnosis of carpal tunnel syndrome.  Overall, these findings are mild in degree electrically and worse on the left.   Thank you for allowing me to participate in patient's care.  If I can answer any additional questions, I would be pleased to do so.    Sincerely,    Maryse Brierley K. Allena Katz, DO

## 2021-12-09 NOTE — Patient Instructions (Signed)
Refer to neck physiotherapy

## 2022-01-06 ENCOUNTER — Ambulatory Visit: Payer: Managed Care, Other (non HMO) | Admitting: Neurology

## 2022-04-13 ENCOUNTER — Telehealth: Payer: Self-pay

## 2022-04-13 NOTE — Telephone Encounter (Signed)
Patient is calling in stating she left a message on our voicemail on Monday asking if Dr.Patel will fill out a form that says it is medically necessary for her to receive therapeutic massage due to the numbness and tingling in hands.

## 2022-04-20 NOTE — Telephone Encounter (Signed)
Received paperwork from patient for letter of medical necessity has been received and filled out for patient. Called patient to inform her that her paperwork has been completed and patient has asked for me to fax paperwork to [phone].

## 2022-06-29 ENCOUNTER — Telehealth: Payer: Self-pay

## 2022-06-29 NOTE — Telephone Encounter (Signed)
Patient needs a letter to terminate her yearly contract with [employer], states that she has not had any medical relief from them and wont let her back out of the contract.

## 2022-07-04 NOTE — Telephone Encounter (Signed)
Letter ready.

## 2022-07-04 NOTE — Telephone Encounter (Signed)
Called patient and informed her that her letter is ready for pick up. Patient thanked me for the call and will come pick it up tomorrow.

## 2022-08-09 IMAGING — CT CT HEAD W/O CM
3 series · 16 of 47 positions shown, 19 images · non-contrast
Comparison: Report from September 27, 2010 MRI (without images).

CLINICAL DATA: Head trauma, moderate-severe



[Series 2: head wo · axial · 0.42mm/px · z∈[-186,-51]mm · 10 of 33 slices shown, 13 images]
[im 3/33  brain]
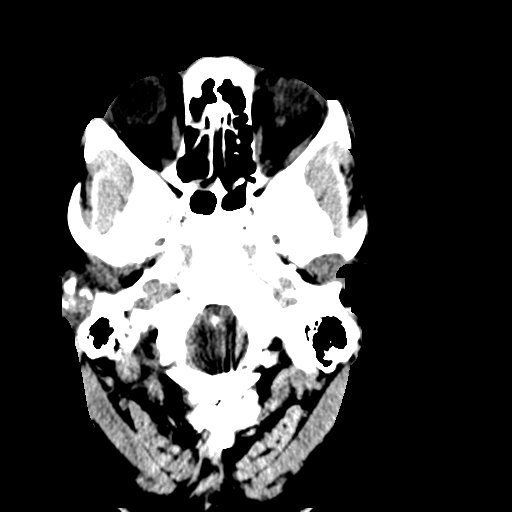
[im 3/33  bone]
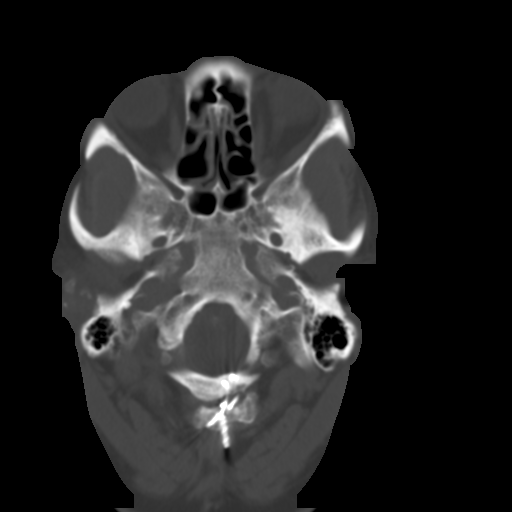
[im 6/33  brain]
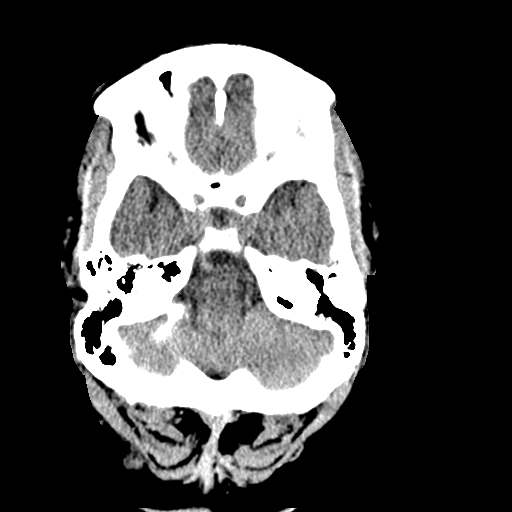
[im 9/33  brain]
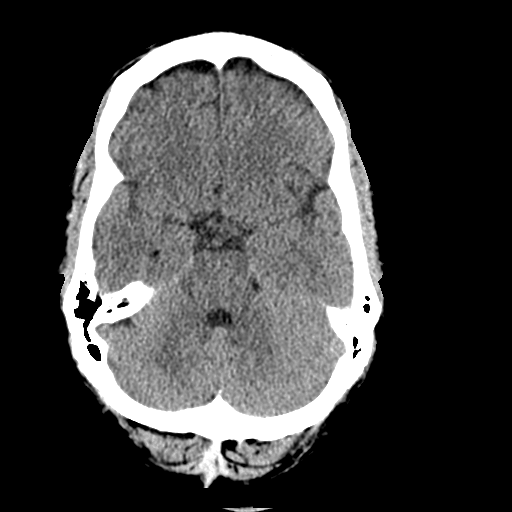
[im 12/33  brain]
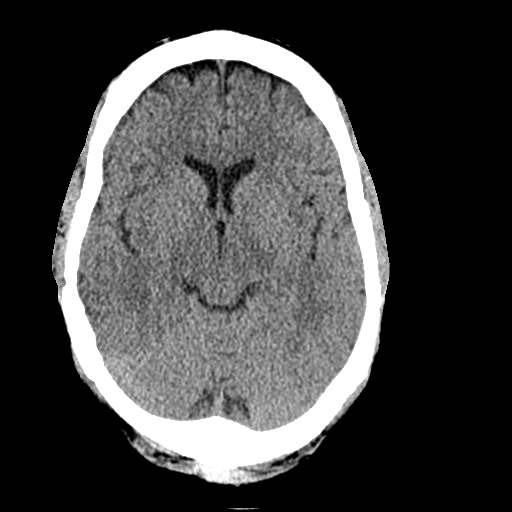
[im 15/33  brain]
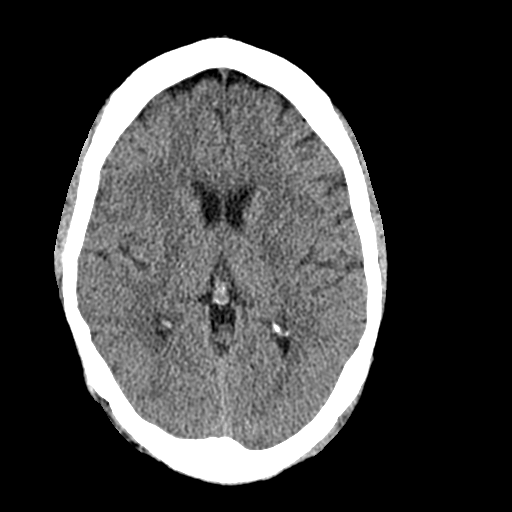
[im 15/33  bone]
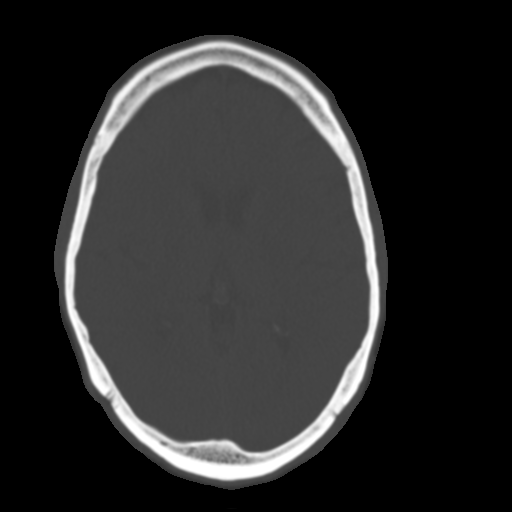
[im 18/33  brain]
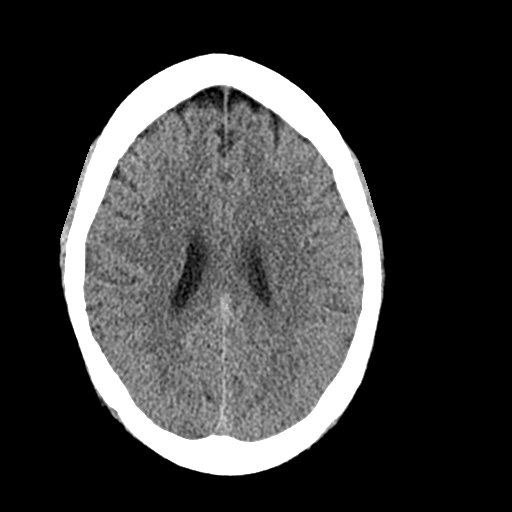
[im 21/33  brain]
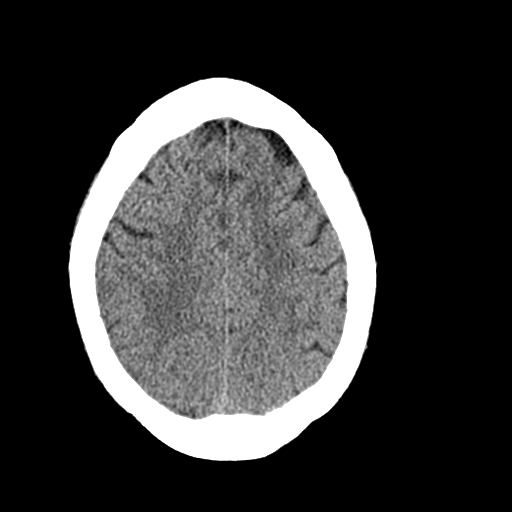
[im 25/33  brain]
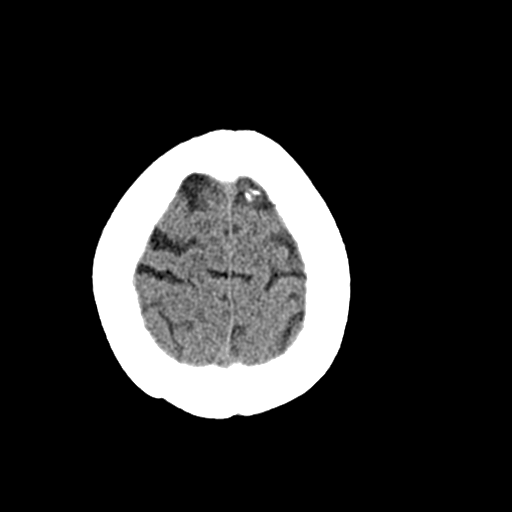
[im 27/33  brain]
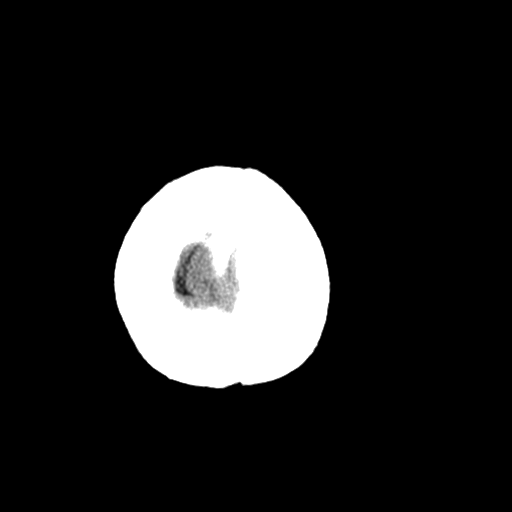
[im 27/33  bone]
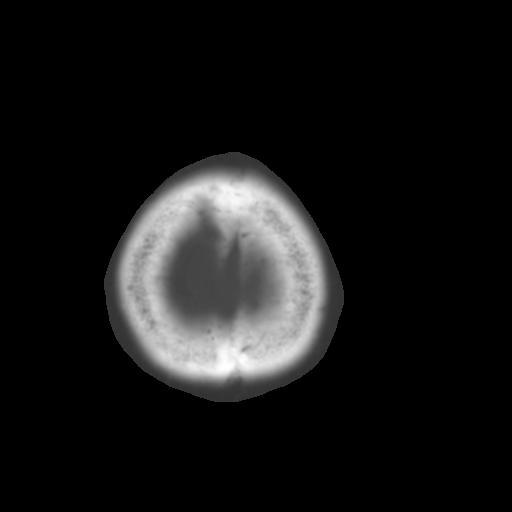
[im 30/33  brain]
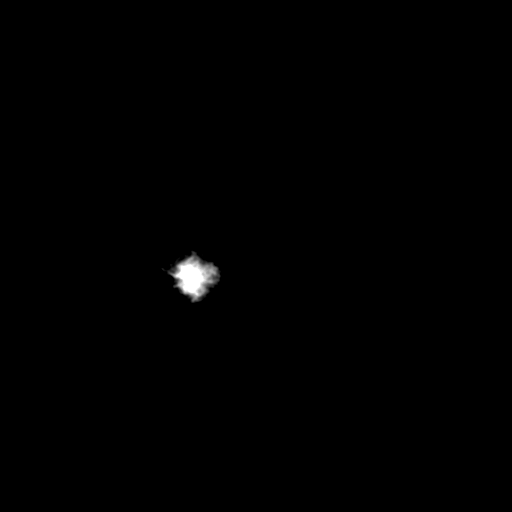

[Series 4: cor soft · coronal · 0.32mm/px · 3 of 68 slices shown]
[im 23/68  brain]
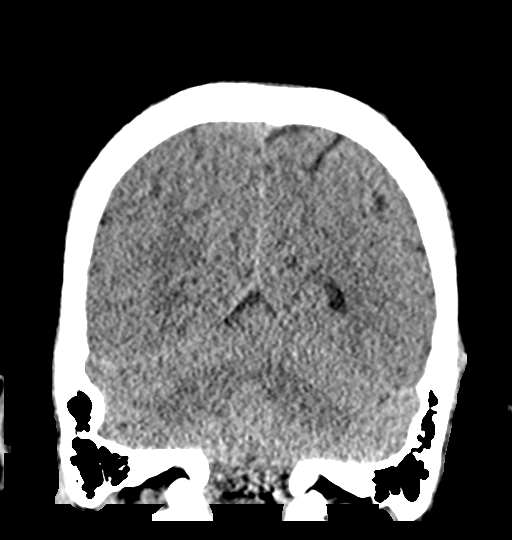
[im 30/68  brain]
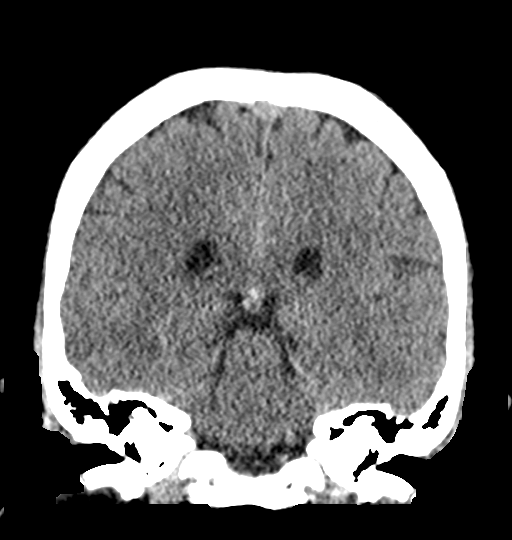
[im 38/68  brain]
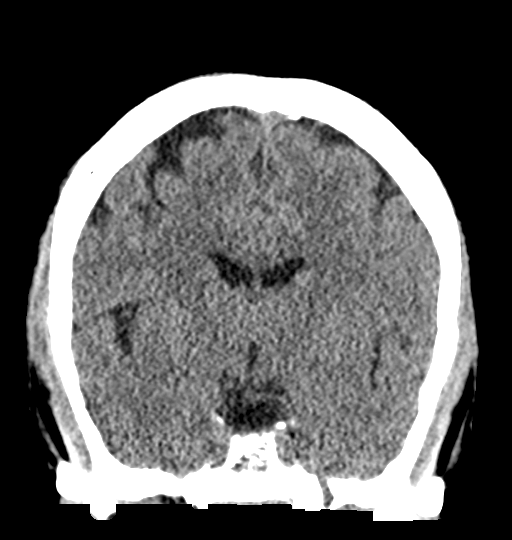

[Series 5: sag soft · sagittal · 0.34mm/px · 3 of 50 slices shown]
[im 17/50  brain]
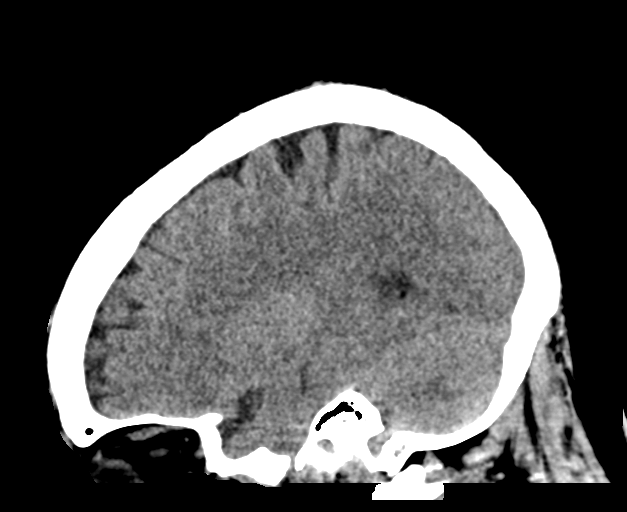
[im 25/50  brain]
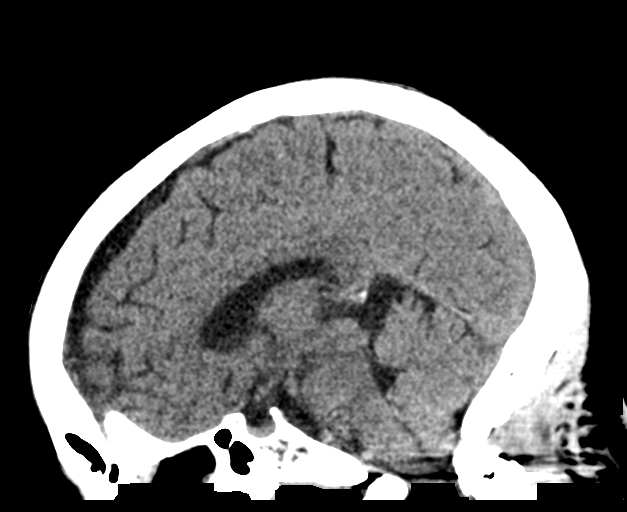
[im 33/50  brain]
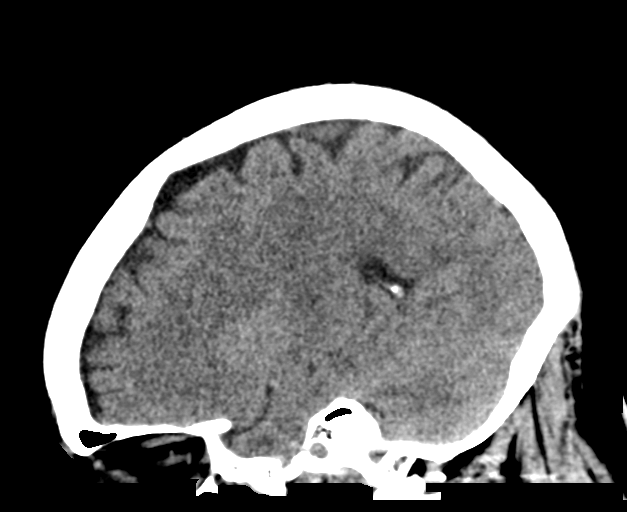

[16 of 47 positions shown; findings below may reference images not displayed]

FINDINGS: Brain: No evidence of acute infarction, hemorrhage, hydrocephalus,
extra-axial collection or mass lesion/mass effect.

Vascular: No hyperdense vessel identified.

Skull: No acute fracture.

Sinuses/Orbits: Mild paranasal sinus mucosal thickening the
visualized sinuses without visible air-fluid level. Unremarkable
visualized orbits.

Other: No mastoid effusions. Incompletely imaged postoperative
change at the craniocervical junction.
IMPRESSION: No evidence of acute intracranial abnormality.
